# Patient Record
Sex: Female | Born: 1965 | Race: Black or African American | Hispanic: No | Marital: Married | Smoking: Never smoker
Health system: Southern US, Community
[De-identification: ages and names within clinical notes are randomized; demographics above are authoritative.]

## PROBLEM LIST (undated history)

## (undated) DIAGNOSIS — D72829 Elevated white blood cell count, unspecified: Secondary | ICD-10-CM

## (undated) HISTORY — PX: ABDOMINAL HYSTERECTOMY: SHX81

## (undated) HISTORY — PX: ECTOPIC PREGNANCY SURGERY: SHX613

---

## 2017-05-15 DIAGNOSIS — Z719 Counseling, unspecified: Secondary | ICD-10-CM | POA: Diagnosis not present

## 2017-08-09 DIAGNOSIS — H40053 Ocular hypertension, bilateral: Secondary | ICD-10-CM | POA: Diagnosis not present

## 2017-08-14 DIAGNOSIS — Z1231 Encounter for screening mammogram for malignant neoplasm of breast: Secondary | ICD-10-CM | POA: Diagnosis not present

## 2017-08-15 DIAGNOSIS — R928 Other abnormal and inconclusive findings on diagnostic imaging of breast: Secondary | ICD-10-CM | POA: Diagnosis not present

## 2017-10-05 DIAGNOSIS — D729 Disorder of white blood cells, unspecified: Secondary | ICD-10-CM | POA: Diagnosis not present

## 2017-10-05 DIAGNOSIS — D72829 Elevated white blood cell count, unspecified: Secondary | ICD-10-CM | POA: Diagnosis not present

## 2018-01-13 ENCOUNTER — Emergency Department (HOSPITAL_BASED_OUTPATIENT_CLINIC_OR_DEPARTMENT_OTHER): Payer: 59

## 2018-01-13 ENCOUNTER — Other Ambulatory Visit: Payer: Self-pay

## 2018-01-13 ENCOUNTER — Emergency Department (HOSPITAL_BASED_OUTPATIENT_CLINIC_OR_DEPARTMENT_OTHER)
Admission: EM | Admit: 2018-01-13 | Discharge: 2018-01-13 | Disposition: A | Discharge status: Home / Self Care | Payer: 59 | Attending: Emergency Medicine | Admitting: Emergency Medicine

## 2018-01-13 ENCOUNTER — Encounter (HOSPITAL_BASED_OUTPATIENT_CLINIC_OR_DEPARTMENT_OTHER): Payer: Self-pay | Admitting: Adult Health

## 2018-01-13 DIAGNOSIS — J181 Lobar pneumonia, unspecified organism: Secondary | ICD-10-CM

## 2018-01-13 DIAGNOSIS — J189 Pneumonia, unspecified organism: Secondary | ICD-10-CM | POA: Diagnosis not present

## 2018-01-13 DIAGNOSIS — R05 Cough: Secondary | ICD-10-CM | POA: Diagnosis not present

## 2018-01-13 DIAGNOSIS — R0602 Shortness of breath: Secondary | ICD-10-CM | POA: Diagnosis not present

## 2018-01-13 DIAGNOSIS — R0789 Other chest pain: Secondary | ICD-10-CM | POA: Diagnosis not present

## 2018-01-13 LAB — BASIC METABOLIC PANEL
ANION GAP: 8 (ref 5–15)
BUN: 10 mg/dL (ref 6–20)
CALCIUM: 9 mg/dL (ref 8.9–10.3)
CO2: 24 mmol/L (ref 22–32)
CREATININE: 0.62 mg/dL (ref 0.44–1.00)
Chloride: 110 mmol/L (ref 98–111)
GFR calc non Af Amer: >60 mL/min (ref >60)
Glucose, Bld: 140 mg/dL — ABNORMAL HIGH (ref 70–99)
Potassium: 4.1 mmol/L (ref 3.5–5.1)
SODIUM: 142 mmol/L (ref 135–145)

## 2018-01-13 LAB — CBC WITH DIFFERENTIAL/PLATELET
BASOS ABS: 0 10*3/uL (ref 0.0–0.1)
BASOS PCT: 0 %
EOS ABS: 0.1 10*3/uL (ref 0.0–0.7)
Eosinophils Relative: 1 %
HEMATOCRIT: 38.9 % (ref 36.0–46.0)
HEMOGLOBIN: 13.3 g/dL (ref 12.0–15.0)
Lymphocytes Relative: 19 %
Lymphs Abs: 3.2 10*3/uL (ref 0.7–4.0)
MCH: 27.9 pg (ref 26.0–34.0)
MCHC: 34.2 g/dL (ref 30.0–36.0)
MCV: 81.7 fL (ref 78.0–100.0)
Monocytes Absolute: 1.1 10*3/uL — ABNORMAL HIGH (ref 0.1–1.0)
Monocytes Relative: 6 %
NEUTROS ABS: 12.7 10*3/uL — AB (ref 1.7–7.7)
NEUTROS PCT: 74 %
Platelets: 282 10*3/uL (ref 150–400)
RBC: 4.76 MIL/uL (ref 3.87–5.11)
RDW: 13.7 % (ref 11.5–15.5)
WBC: 17.2 10*3/uL — AB (ref 4.0–10.5)

## 2018-01-13 LAB — D-DIMER, QUANTITATIVE (NOT AT ARMC): D DIMER QUANT: 0.3 ug{FEU}/mL (ref 0.00–0.50)

## 2018-01-13 MED ORDER — ALBUTEROL SULFATE (2.5 MG/3ML) 0.083% IN NEBU
5.0000 mg | INHALATION_SOLUTION | Freq: Once | RESPIRATORY_TRACT | Status: AC
  Administered 2018-01-13: 5 mg via RESPIRATORY_TRACT
  Filled 2018-01-13: qty 6

## 2018-01-13 MED ORDER — DOXYCYCLINE HYCLATE 100 MG PO CAPS: ORAL_CAPSULE | ORAL | 0 refills | Status: AC

## 2018-01-13 MED ORDER — ALBUTEROL SULFATE HFA 108 (90 BASE) MCG/ACT IN AERS: 1.0000 | INHALATION_SPRAY | Freq: Four times a day (QID) | RESPIRATORY_TRACT | 0 refills | Status: AC | PRN

## 2018-01-13 MED ORDER — DOXYCYCLINE HYCLATE 100 MG PO TABS
100.0000 mg | ORAL_TABLET | Freq: Once | ORAL | Status: AC
  Administered 2018-01-13: 100 mg via ORAL
  Filled 2018-01-13: qty 1

## 2018-01-13 NOTE — ED Notes (Signed)
ED Provider at bedside. 

## 2018-01-13 NOTE — ED Notes (Signed)
Pt placed on cardiac monitor and on auto vs.

## 2018-01-13 NOTE — ED Triage Notes (Signed)
PResents with chest tightness that began today, she has been sick with an URI and has a lot of drainage, this evening her chest became tight and she became SOB. Breath sounds clear.

## 2018-01-13 NOTE — ED Provider Notes (Signed)
[facility] EMERGENCY DEPARTMENT Provider Note   CSN: [id number] Arrival date & time: 01/13/18  0018     History   Chief Complaint Chief Complaint  Patient presents with  . Chest Pain    HPI Sara Mclean is a 52 y.o. female.  The history is provided by the patient and the spouse.  Shortness of Breath  This is a new problem. The average episode lasts 12 hours. The problem occurs frequently.The problem has been gradually worsening. Associated symptoms include cough. Pertinent negatives include no fever, no hemoptysis, no vomiting, no abdominal pain and no leg swelling. Associated symptoms comments: Chest tightness. She has tried nothing for the symptoms.  Patient presents with cough, shortness of breath chest tightness.  She reports she felt like she was fighting a sinus infection this past week.  Today she noticed increasing cough felt she could not clear her chest.  This led to some chest tightness and shortness of breath.  She thought she may develop pneumonia. No history of CAD/PE.  She has been followed by hematology for neutrophilia, but no other acute issues   PMH-none Soc hx - nonsmoker, no travel OB History   None      Home Medications    Prior to Admission medications   Not on File    Family History History reviewed. No pertinent family history.  Social History Social History   Tobacco Use  . Smoking status: Never Smoker  . Smokeless tobacco: Never Used  Substance Use Topics  . Alcohol use: Not Currently  . Drug use: Never     Allergies   Sulfa antibiotics   Review of Systems Review of Systems  Constitutional: Negative for fever.  Respiratory: Positive for cough and shortness of breath. Negative for hemoptysis.   Cardiovascular: Negative for leg swelling.  Gastrointestinal: Negative for abdominal pain and vomiting.  All other systems reviewed and are negative.    Physical Exam Updated Vital Signs BP (!) 143/97   Pulse (!) 118    Temp 98.3 F (36.8 C) (Oral)   Resp (!) 22   SpO2 96%   Physical Exam  CONSTITUTIONAL: Well developed/well nourished HEAD: Normocephalic/atraumatic EYES: EOMI/PERRL ENMT: Mucous membranes moist, uvula midline without erythema or exudate NECK: supple no meningeal signs SPINE/BACK:entire spine nontender CV: S1/S2 noted, no murmurs/rubs/gallops noted LUNGS: Crackles left base, mild tachypnea ABDOMEN: soft, nontender, no rebound or guarding, bowel sounds noted throughout abdomen GU:no cva tenderness NEURO: Pt is awake/alert/appropriate, moves all extremitiesx4.  No facial droop.   EXTREMITIES: pulses normal/equal, full ROM, no lower extremity edema or tenderness SKIN: warm, color normal PSYCH: no abnormalities of mood noted, alert and oriented to situation  ED Treatments / Results  Labs (all labs ordered are listed, but only abnormal results are displayed) Labs Reviewed  BASIC METABOLIC PANEL - Abnormal; Notable for the following components:      Result Value   Glucose, Bld 140 (*)    All other components within normal limits  CBC WITH DIFFERENTIAL/PLATELET - Abnormal; Notable for the following components:   WBC 17.2 (*)    Neutro Abs 12.7 (*)    Monocytes Absolute 1.1 (*)    All other components within normal limits  D-DIMER, QUANTITATIVE (NOT AT [facility])    EKG ED ECG REPORT   Date: 01/13/2018 0028  Rate: 119  Rhythm: sinus tachycardia  QRS Axis: normal  Intervals: normal  ST/T Wave abnormalities: twi  Conduction Disutrbances:none  Narrative Interpretation:   Old EKG Reviewed: none available  I have personally reviewed the EKG tracing and agree with the computerized printout as noted.  Radiology Dg Chest 2 View  Result Date: 01/13/2018 CLINICAL DATA:  Cough and congestion for 3 days. Chest tightness. Shortness of breath. EXAM: CHEST - 2 VIEW COMPARISON:  None. FINDINGS: Cardiomediastinal silhouette is normal. Low inspiratory examination. Patchy LEFT lower lobe  airspace opacity. No pleural effusion. Trachea projects midline and there is no pneumothorax. Soft tissue planes and included osseous structures are non-suspicious. Lower thoracic dextroscoliosis. IMPRESSION: LEFT lower lobe atelectasis versus pneumonia. Electronically Signed   By: Awilda Metro M.D.   On: 01/13/2018 01:12    Procedures Procedures  Medications Ordered in ED Medications  albuterol (PROVENTIL) (2.5 MG/3ML) 0.083% nebulizer solution 5 mg (5 mg Nebulization Given 01/13/18 0301)  doxycycline (VIBRA-TABS) tablet 100 mg (100 mg Oral Given 01/13/18 0259)     Initial Impression / Assessment and Plan / ED Course  I have reviewed the triage vital signs and the nursing notes.  Pertinent labs & imaging results that were available during my care of the patient were reviewed by me and considered in my medical decision making (see chart for details).     Patient presents with recent cough and cold symptoms with now worsening over the past day.  She had some chest tightness that did improve with albuterol.  She had crackles in the left base, in conjunction with abnormal x-ray and elevated white count, suspect community acquired pneumonia.  She is otherwise healthy at baseline and  is a non-smoker. She felt improved here.  She did have some residual tachycardia after the albuterol but she is asymptomatic.  She walked around the ER and felt well without any dyspnea on exertion.  Pulse ox remained 93% and above.  She would like to go home, this appears reasonable.  We will start her on doxycycline twice daily for 7 days.  Albuterol was given as she did have some good response here in the ER. We discussed strict ER return precautions.  Patient feels comfortable with plan. Final Clinical Impressions(s) / ED Diagnoses   Final diagnoses:  Community acquired pneumonia of left lower lobe of lung [facility])    ED Discharge Orders         Ordered    albuterol (PROVENTIL HFA;VENTOLIN HFA) 108 (90  Base) MCG/ACT inhaler  Every 6 hours PRN     01/13/18 0353    doxycycline (VIBRAMYCIN) 100 MG capsule     01/13/18 0353           Zadie Rhine, MD 01/13/18 [phone]

## 2018-01-13 NOTE — ED Notes (Signed)
Pt ambulated without difficulty. SPO2 93%-97%. HR 125-133.

## 2018-02-21 DIAGNOSIS — Z01419 Encounter for gynecological examination (general) (routine) without abnormal findings: Secondary | ICD-10-CM | POA: Diagnosis not present

## 2018-02-21 DIAGNOSIS — R030 Elevated blood-pressure reading, without diagnosis of hypertension: Secondary | ICD-10-CM | POA: Diagnosis not present

## 2018-02-21 DIAGNOSIS — Z0000 Encounter for general adult medical examination without abnormal findings: Secondary | ICD-10-CM | POA: Diagnosis not present

## 2018-02-21 DIAGNOSIS — R609 Edema, unspecified: Secondary | ICD-10-CM | POA: Diagnosis not present

## 2018-03-12 DIAGNOSIS — H40053 Ocular hypertension, bilateral: Secondary | ICD-10-CM | POA: Diagnosis not present

## 2018-04-23 DIAGNOSIS — H40053 Ocular hypertension, bilateral: Secondary | ICD-10-CM | POA: Diagnosis not present

## 2018-06-05 DIAGNOSIS — D729 Disorder of white blood cells, unspecified: Secondary | ICD-10-CM | POA: Diagnosis not present

## 2018-09-05 DIAGNOSIS — M542 Cervicalgia: Secondary | ICD-10-CM | POA: Diagnosis not present

## 2018-09-05 DIAGNOSIS — R208 Other disturbances of skin sensation: Secondary | ICD-10-CM | POA: Diagnosis not present

## 2020-02-24 IMAGING — CR DG CHEST 2V
2 series · 2 of 2 positions shown · non-contrast
Comparison: None.

CLINICAL DATA: Cough and congestion for 3 days. Chest tightness.
Shortness of breath.

EXAM:
CHEST - 2 VIEW

[w chest pa]
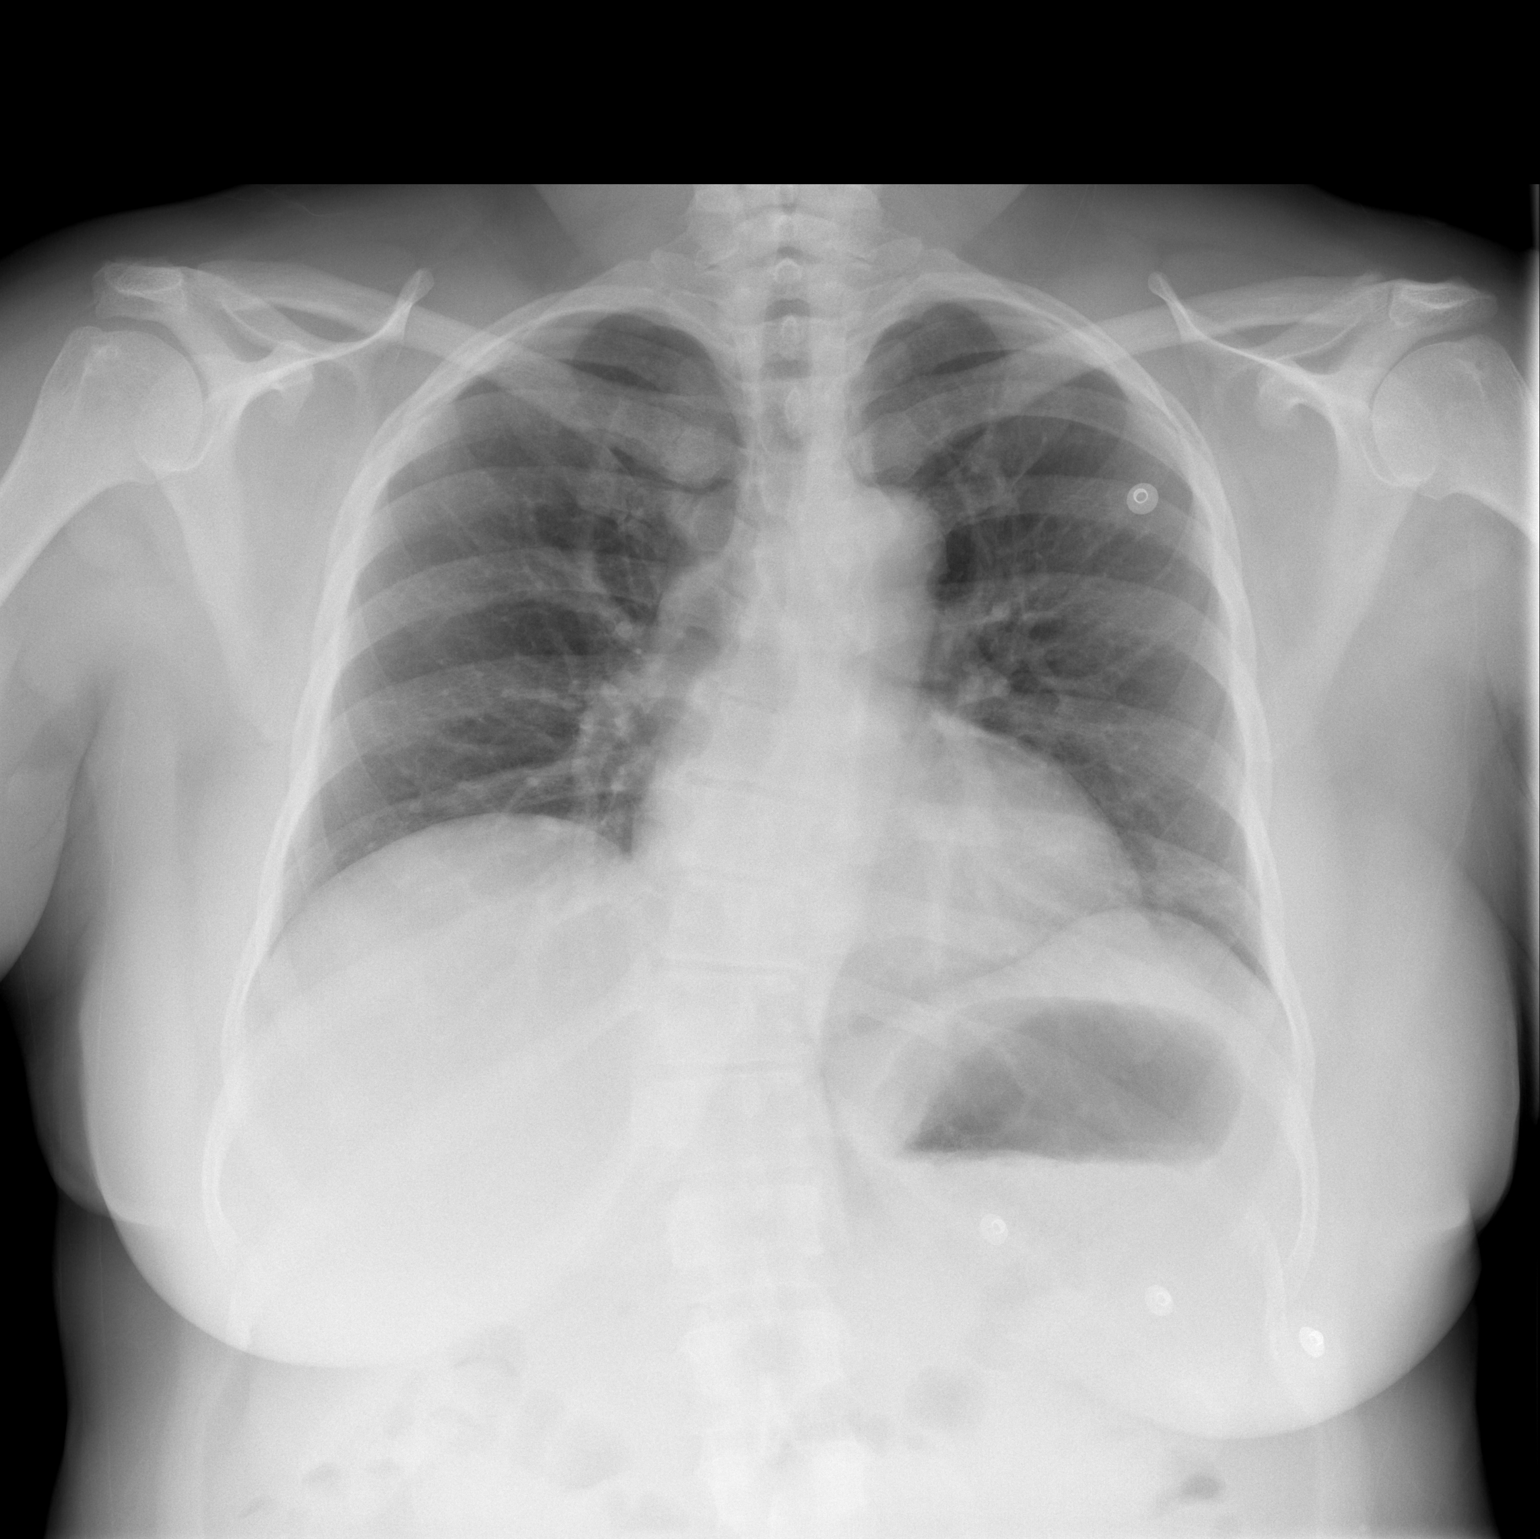

[w chest lat]
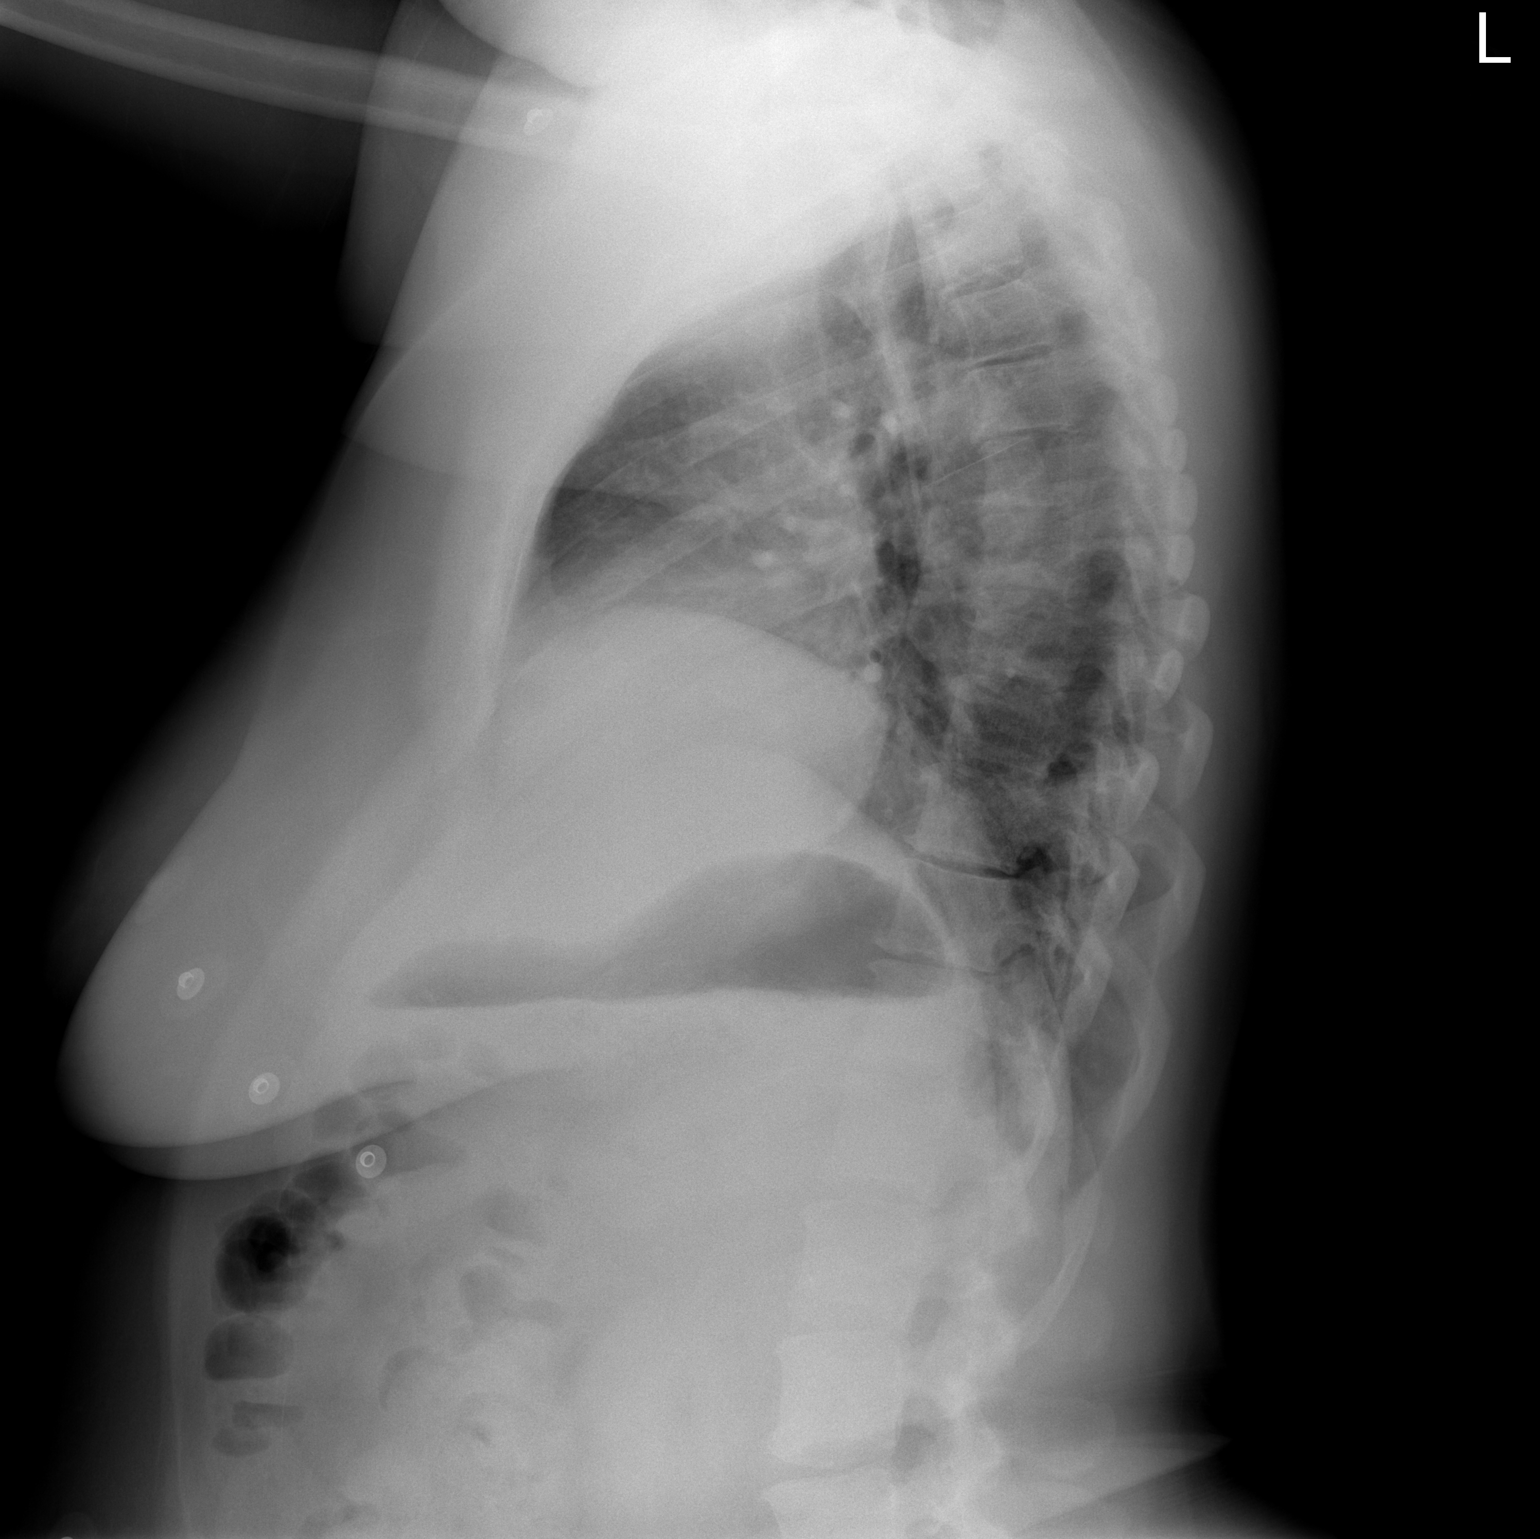

[2 of 2 positions shown; findings below may reference images not displayed]

FINDINGS: Cardiomediastinal silhouette is normal. Low inspiratory examination.
Patchy LEFT lower lobe airspace opacity. No pleural effusion.
Trachea projects midline and there is no pneumothorax. Soft tissue
planes and included osseous structures are non-suspicious. Lower
thoracic dextroscoliosis.
IMPRESSION: LEFT lower lobe atelectasis versus pneumonia.

## 2021-10-01 ENCOUNTER — Other Ambulatory Visit: Payer: Self-pay

## 2021-10-01 ENCOUNTER — Encounter (HOSPITAL_BASED_OUTPATIENT_CLINIC_OR_DEPARTMENT_OTHER): Payer: Self-pay | Admitting: Emergency Medicine

## 2021-10-01 ENCOUNTER — Emergency Department (HOSPITAL_BASED_OUTPATIENT_CLINIC_OR_DEPARTMENT_OTHER)
Admission: EM | Admit: 2021-10-01 | Discharge: 2021-10-01 | Disposition: A | Discharge status: Home / Self Care | Payer: 59 | Attending: Emergency Medicine | Admitting: Emergency Medicine

## 2021-10-01 DIAGNOSIS — J45909 Unspecified asthma, uncomplicated: Secondary | ICD-10-CM | POA: Insufficient documentation

## 2021-10-01 DIAGNOSIS — Z7951 Long term (current) use of inhaled steroids: Secondary | ICD-10-CM | POA: Diagnosis not present

## 2021-10-01 DIAGNOSIS — M542 Cervicalgia: Secondary | ICD-10-CM | POA: Insufficient documentation

## 2021-10-01 HISTORY — DX: Elevated white blood cell count, unspecified: D72.829

## 2021-10-01 MED ORDER — OXYCODONE-ACETAMINOPHEN 5-325 MG PO TABS
2.0000 | ORAL_TABLET | Freq: Once | ORAL | Status: AC
  Administered 2021-10-01: 2 via ORAL
  Filled 2021-10-01: qty 2

## 2021-10-01 MED ORDER — METHOCARBAMOL 500 MG PO TABS: 500.0000 mg | ORAL_TABLET | Freq: Three times a day (TID) | ORAL | 0 refills | Status: AC | PRN

## 2021-10-01 MED ORDER — PREDNISONE 10 MG PO TABS: 20.0000 mg | ORAL_TABLET | Freq: Two times a day (BID) | ORAL | 0 refills | Status: AC

## 2021-10-01 MED ORDER — PREDNISONE 20 MG PO TABS
40.0000 mg | ORAL_TABLET | Freq: Once | ORAL | Status: AC
  Administered 2021-10-01: 40 mg via ORAL
  Filled 2021-10-01: qty 2

## 2021-10-01 MED ORDER — KETOROLAC TROMETHAMINE 60 MG/2ML IM SOLN
60.0000 mg | Freq: Once | INTRAMUSCULAR | Status: AC
  Administered 2021-10-01: 60 mg via INTRAMUSCULAR
  Filled 2021-10-01: qty 2

## 2021-10-01 NOTE — Discharge Instructions (Signed)
Begin taking prednisone and Robaxin as prescribed.  Follow-up with primary doctor if not improving in the next week, and return to the ER symptoms significantly worsen or change.

## 2021-10-01 NOTE — ED Provider Notes (Signed)
[facility] EMERGENCY DEPARTMENT Provider Note   CSN: [id number] Arrival date & time: 10/01/21  0425     History  Chief Complaint  Patient presents with   Neck Pain    Sara Mclean is a 56 y.o. female.  Patient is a 56 year old female with past medical history of only asthma.  She presents today with complaints of neck pain.  This started 2 mornings ago when she woke up.  She thought maybe she slept wrong, but pain has worsened throughout the next 2 days.  She denies any pain radiating down her arms.  She denies any weakness, or numbness.  She denies any fevers, chills, or headache.  This began in the absence of any specific injury or trauma.  The history is provided by the patient.       Home Medications Prior to Admission medications   Medication Sig Start Date End Date Taking? Authorizing Provider  albuterol (PROVENTIL HFA;VENTOLIN HFA) 108 (90 Base) MCG/ACT inhaler Inhale 1-2 puffs into the lungs every 6 (six) hours as needed for wheezing or shortness of breath. 01/13/18   Zadie Rhine, MD  doxycycline (VIBRAMYCIN) 100 MG capsule One po bid x 7 days 01/13/18   Zadie Rhine, MD      Allergies    Sulfa antibiotics    Review of Systems   Review of Systems  All other systems reviewed and are negative.   Physical Exam Updated Vital Signs BP (!) 147/89 (BP Location: Right Arm)   Pulse 87   Temp 97.7 F (36.5 C) (Oral)   Resp 16   Ht 5\' 1"  (1.549 m)   Wt 90.2 kg   SpO2 96%   BMI 37.58 kg/m  Physical Exam Vitals and nursing note reviewed.  Constitutional:      General: She is not in acute distress.    Appearance: She is well-developed. She is not diaphoretic.  HENT:     Head: Normocephalic and atraumatic.  Neck:     Comments: There is mild tenderness in the soft tissues of the posterior cervical region.  There is no palpable abnormality.  She has pain with range of motion of the neck. Cardiovascular:     Rate and Rhythm: Normal rate and regular  rhythm.     Heart sounds: No murmur heard.    No friction rub. No gallop.  Pulmonary:     Effort: Pulmonary effort is normal. No respiratory distress.     Breath sounds: Normal breath sounds. No wheezing.  Abdominal:     General: Bowel sounds are normal. There is no distension.     Palpations: Abdomen is soft.     Tenderness: There is no abdominal tenderness.  Musculoskeletal:        General: Normal range of motion.     Cervical back: Rigidity present.  Skin:    General: Skin is warm and dry.  Neurological:     General: No focal deficit present.     Mental Status: She is alert and oriented to person, place, and time.     ED Results / Procedures / Treatments   Labs (all labs ordered are listed, but only abnormal results are displayed) Labs Reviewed - No data to display  EKG None  Radiology No results found.  Procedures Procedures    Medications Ordered in ED Medications  ketorolac (TORADOL) injection 60 mg (has no administration in time range)  oxyCODONE-acetaminophen (PERCOCET/ROXICET) 5-325 MG per tablet 2 tablet (has no administration in time range)  predniSONE (  DELTASONE) tablet 40 mg (has no administration in time range)    ED Course/ Medical Decision Making/ A&P  Patient presenting with neck pain as described in the HPI.  This began in the absence of any specific injury or trauma.  I highly doubt an emergent cause such as meningitis.  She is neurologically intact.  Patient feeling better after receiving prednisone, Toradol, and Percocet.  She will be discharged with prednisone and Robaxin.  To follow-up with primary doctor if not improving and return to the ER if symptoms worsen or change.  Final Clinical Impression(s) / ED Diagnoses Final diagnoses:  None    Rx / DC Orders ED Discharge Orders     None         Geoffery Lyons, MD 10/01/21 [phone]

## 2021-10-01 NOTE — ED Triage Notes (Signed)
Pt states she woke up Thursday night and thought she had a crick in her neck  Pt states since then the pain has gotten worse  Pt states she has been taking OTC medication and using a heating pad without relief   Pt states the pain is now in the middle of her neck and her whole neck hurts
